# Patient Record
Sex: Male | Born: 1970 | Race: White | Hispanic: No | Marital: Married | Smoking: Never smoker
Health system: Southern US, Community
[De-identification: ages and names within clinical notes are randomized; demographics above are authoritative.]

## PROBLEM LIST (undated history)

## (undated) DIAGNOSIS — I10 Essential (primary) hypertension: Secondary | ICD-10-CM

## (undated) HISTORY — PX: NO PAST SURGERIES: SHX2092

---

## 2015-09-16 ENCOUNTER — Other Ambulatory Visit: Payer: Self-pay | Admitting: Internal Medicine

## 2015-09-16 DIAGNOSIS — R748 Abnormal levels of other serum enzymes: Secondary | ICD-10-CM

## 2015-09-26 ENCOUNTER — Ambulatory Visit
Admission: RE | Admit: 2015-09-26 | Discharge: 2015-09-26 | Disposition: A | Discharge status: Home / Self Care | Payer: PRIVATE HEALTH INSURANCE | Source: Ambulatory Visit | Attending: Internal Medicine | Admitting: Internal Medicine

## 2015-09-26 DIAGNOSIS — R748 Abnormal levels of other serum enzymes: Secondary | ICD-10-CM

## 2017-01-25 DIAGNOSIS — F419 Anxiety disorder, unspecified: Secondary | ICD-10-CM

## 2017-01-25 DIAGNOSIS — K219 Gastro-esophageal reflux disease without esophagitis: Secondary | ICD-10-CM | POA: Insufficient documentation

## 2017-01-25 DIAGNOSIS — Z8719 Personal history of other diseases of the digestive system: Secondary | ICD-10-CM | POA: Insufficient documentation

## 2017-01-25 DIAGNOSIS — F1010 Alcohol abuse, uncomplicated: Secondary | ICD-10-CM | POA: Insufficient documentation

## 2017-01-25 DIAGNOSIS — R072 Precordial pain: Secondary | ICD-10-CM | POA: Insufficient documentation

## 2017-01-25 DIAGNOSIS — R7989 Other specified abnormal findings of blood chemistry: Secondary | ICD-10-CM

## 2017-01-25 HISTORY — DX: Alcohol abuse, uncomplicated: F10.10

## 2017-01-25 HISTORY — DX: Anxiety disorder, unspecified: F41.9

## 2017-01-25 HISTORY — DX: Personal history of other diseases of the digestive system: Z87.19

## 2017-01-25 HISTORY — DX: Precordial pain: R07.2

## 2017-01-25 HISTORY — DX: Other specified abnormal findings of blood chemistry: R79.89

## 2017-01-25 HISTORY — DX: Gastro-esophageal reflux disease without esophagitis: K21.9

## 2019-07-09 ENCOUNTER — Encounter (HOSPITAL_COMMUNITY): Payer: Self-pay | Admitting: Emergency Medicine

## 2019-07-09 ENCOUNTER — Emergency Department (HOSPITAL_COMMUNITY)
Admission: EM | Admit: 2019-07-09 | Discharge: 2019-07-09 | Disposition: A | Discharge status: Home / Self Care | Payer: Worker's Compensation | Attending: Emergency Medicine | Admitting: Emergency Medicine

## 2019-07-09 ENCOUNTER — Emergency Department (HOSPITAL_COMMUNITY): Payer: Worker's Compensation

## 2019-07-09 DIAGNOSIS — Y9389 Activity, other specified: Secondary | ICD-10-CM | POA: Diagnosis not present

## 2019-07-09 DIAGNOSIS — S060X1A Concussion with loss of consciousness of 30 minutes or less, initial encounter: Secondary | ICD-10-CM | POA: Diagnosis not present

## 2019-07-09 DIAGNOSIS — S5001XA Contusion of right elbow, initial encounter: Secondary | ICD-10-CM | POA: Diagnosis not present

## 2019-07-09 DIAGNOSIS — Y990 Civilian activity done for income or pay: Secondary | ICD-10-CM | POA: Insufficient documentation

## 2019-07-09 DIAGNOSIS — Y929 Unspecified place or not applicable: Secondary | ICD-10-CM | POA: Diagnosis not present

## 2019-07-09 DIAGNOSIS — W19XXXA Unspecified fall, initial encounter: Secondary | ICD-10-CM

## 2019-07-09 DIAGNOSIS — T07XXXA Unspecified multiple injuries, initial encounter: Secondary | ICD-10-CM | POA: Diagnosis present

## 2019-07-09 HISTORY — DX: Essential (primary) hypertension: I10

## 2019-07-09 LAB — BASIC METABOLIC PANEL
Anion gap: 11 (ref 5–15)
BUN: 14 mg/dL (ref 6–20)
CO2: 24 mmol/L (ref 22–32)
Calcium: 8.9 mg/dL (ref 8.9–10.3)
Chloride: 105 mmol/L (ref 98–111)
Creatinine, Ser: 1.11 mg/dL (ref 0.61–1.24)
GFR calc Af Amer: >60 mL/min (ref >60)
GFR calc non Af Amer: >60 mL/min (ref >60)
Glucose, Bld: 156 mg/dL — ABNORMAL HIGH (ref 70–99)
Potassium: 3.6 mmol/L (ref 3.5–5.1)
Sodium: 140 mmol/L (ref 135–145)

## 2019-07-09 LAB — CBC
HCT: 41.3 % (ref 39.0–52.0)
Hemoglobin: 14.5 g/dL (ref 13.0–17.0)
MCH: 30.8 pg (ref 26.0–34.0)
MCHC: 35.1 g/dL (ref 30.0–36.0)
MCV: 87.7 fL (ref 80.0–100.0)
Platelets: 212 10*3/uL (ref 150–400)
RBC: 4.71 MIL/uL (ref 4.22–5.81)
RDW: 12.3 % (ref 11.5–15.5)
WBC: 5.8 10*3/uL (ref 4.0–10.5)
nRBC: 0 % (ref 0.0–0.2)
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
## 2019-07-09 NOTE — ED Notes (Signed)
Pt being transported to CT
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
## 2019-07-09 NOTE — ED Notes (Signed)
ED Provider at bedside. 

## 2020-02-08 DEATH — deceased

## 2021-05-23 DIAGNOSIS — I10 Essential (primary) hypertension: Secondary | ICD-10-CM | POA: Insufficient documentation

## 2021-05-24 ENCOUNTER — Encounter: Payer: Self-pay | Admitting: Cardiology

## 2021-05-24 ENCOUNTER — Ambulatory Visit (INDEPENDENT_AMBULATORY_CARE_PROVIDER_SITE_OTHER): Payer: PRIVATE HEALTH INSURANCE | Admitting: Cardiology

## 2021-05-24 ENCOUNTER — Other Ambulatory Visit: Payer: Self-pay

## 2021-05-24 VITALS — BP 120/80 | HR 68 | Ht 70.0 in | Wt 177.0 lb

## 2021-05-24 DIAGNOSIS — E785 Hyperlipidemia, unspecified: Secondary | ICD-10-CM

## 2021-05-24 DIAGNOSIS — R079 Chest pain, unspecified: Secondary | ICD-10-CM | POA: Diagnosis not present

## 2021-05-24 DIAGNOSIS — R002 Palpitations: Secondary | ICD-10-CM

## 2021-05-24 DIAGNOSIS — I10 Essential (primary) hypertension: Secondary | ICD-10-CM

## 2021-05-24 MED ORDER — METOPROLOL TARTRATE 100 MG PO TABS: ORAL_TABLET | ORAL | 0 refills | Status: DC

## 2021-05-24 MED ORDER — NITROGLYCERIN 0.4 MG SL SUBL: 0.4000 mg | SUBLINGUAL_TABLET | SUBLINGUAL | 3 refills | Status: DC | PRN

## 2021-05-24 NOTE — Progress Notes (Signed)
Cardiology Office Note:    Date:  05/24/2021   ID:  Travis Schneider, DOB [birth year], MRN [id number]  PCP:  [facility]  Cardiologist:  Thomasene Ripple, DO  Electrophysiologist:  None   Referring MD: No ref. provider found   No chief complaint on file. " I have been experiencing chest pain"  History of Present Illness:    Travis Schneider is a 50 y.o. male with a hx of hypertension, hyperlipidemia, family history of premature coronary artery disease with his father undergoing coronary bypass surgery at the age of 37 is here today to be eval for chest pain.  Patient tells me that he has been experiencing intermittent chest discomfort.  He describes this as a midsternal aching sensation which symptom is dull.  Most times it radiates to his shoulder and then down his arm and he sometimes have numbness and tingling.  In addition he has reported experiencing intermittent palpitations.  Which he described as an abrupt onset of fast heartbeat which last for minutes at a time. The chest pain is really the most bothersome.  Past Medical History:  Diagnosis Date   Hypertension     History reviewed. No pertinent surgical history.  Current Medications: Current Meds  Medication Sig   albuterol (VENTOLIN HFA) 108 (90 Base) MCG/ACT inhaler Inhale into the lungs every 6 (six) hours as needed for wheezing or shortness of breath.   ALPRAZolam (XANAX) 0.5 MG tablet Take 0.5 mg by mouth every 8 (eight) hours as needed for anxiety.   amLODipine (NORVASC) 10 MG tablet Take 1 tablet by mouth daily.   aspirin 81 MG EC tablet Take 81 mg by mouth daily.   Cholecalciferol 25 MCG (1000 UT) capsule Take 1,000 Units by mouth at bedtime.   Coenzyme Q10 100 MG capsule Take 100 mg by mouth daily.   levocetirizine (XYZAL) 5 MG tablet Take 5 mg by mouth daily.   metoprolol tartrate (LOPRESSOR) 100 MG tablet Take 2 hours prior to CT   metoprolol tartrate (LOPRESSOR) 25 MG tablet Take 25 mg by mouth  every morning.   nitroGLYCERIN (NITROSTAT) 0.4 MG SL tablet Place 1 tablet (0.4 mg total) under the tongue every 5 (five) minutes as needed for chest pain. Up to three times.   pantoprazole (PROTONIX) 40 MG tablet Take 1 tablet by mouth 2 (two) times daily.   simvastatin (ZOCOR) 20 MG tablet Take 20 mg by mouth daily.   zolpidem (AMBIEN) 10 MG tablet Take 10 mg by mouth at bedtime.     Allergies:   Patient has no known allergies.   Social History   Socioeconomic History   Marital status: Married    Spouse name: Not on file   Number of children: Not on file   Years of education: Not on file   Highest education level: Not on file  Occupational History   Not on file  Tobacco Use   Smoking status: Never   Smokeless tobacco: Never  Substance and Sexual Activity   Alcohol use: Yes   Drug use: Not Currently   Sexual activity: Not on file  Other Topics Concern   Not on file  Social History Narrative   Not on file   Social Determinants of Health   Financial Resource Strain: Not on file  Food Insecurity: Not on file  Transportation Needs: Not on file  Physical Activity: Not on file  Stress: Not on file  Social Connections: Not on file     Family History:  The patient's family history is not on file.  ROS:   Review of Systems  Constitution: Negative for decreased appetite, fever and weight gain.  HENT: Negative for congestion, ear discharge, hoarse voice and sore throat.   Eyes: Negative for discharge, redness, vision loss in right eye and visual halos.  Cardiovascular: Reports chest pain, and palpitation.  Negative for dyspnea on exertion, leg swelling, orthopnea. Respiratory: Negative for cough, hemoptysis, shortness of breath and snoring.   Endocrine: Negative for heat intolerance and polyphagia.  Hematologic/Lymphatic: Negative for bleeding problem. Does not bruise/bleed easily.  Skin: Negative for flushing, nail changes, rash and suspicious lesions.  Musculoskeletal:  Negative for arthritis, joint pain, muscle cramps, myalgias, neck pain and stiffness.  Gastrointestinal: Negative for abdominal pain, bowel incontinence, diarrhea and excessive appetite.  Genitourinary: Negative for decreased libido, genital sores and incomplete emptying.  Neurological: Negative for brief paralysis, focal weakness, headaches and loss of balance.  Psychiatric/Behavioral: Negative for altered mental status, depression and suicidal ideas.  Allergic/Immunologic: Negative for HIV exposure and persistent infections.    EKGs/Labs/Other Studies Reviewed:    The following studies were reviewed today:   EKG:  The ekg ordered today demonstrates sinus rhythm, heart 60 bpm.  Recent Labs: No results found for requested labs within last 8760 hours.  Recent Lipid Panel No results found for: CHOL, TRIG, HDL, CHOLHDL, VLDL, LDLCALC, LDLDIRECT  Physical Exam:    VS:  BP 120/80   Pulse 68   Ht 5\' 10"  (1.778 m)   Wt 177 lb (80.3 kg)   SpO2 97%   BMI 25.40 kg/m     Wt Readings from Last 3 Encounters:  05/24/21 177 lb (80.3 kg)     GEN: Well nourished, well developed in no acute distress HEENT: Normal NECK: No JVD; No carotid bruits LYMPHATICS: No lymphadenopathy CARDIAC: S1S2 noted,RRR, no murmurs, rubs, gallops RESPIRATORY:  Clear to auscultation without rales, wheezing or rhonchi  ABDOMEN: Soft, non-tender, non-distended, +bowel sounds, no guarding. EXTREMITIES: No edema, No cyanosis, no clubbing MUSCULOSKELETAL:  No deformity  SKIN: Warm and dry NEUROLOGIC:  Alert and oriented x 3, non-focal PSYCHIATRIC:  Normal affect, good insight  ASSESSMENT:    1. Chest pain, unspecified type   2. Palpitations   3. Hypertension, unspecified type   4. Hyperlipidemia, unspecified hyperlipidemia type    PLAN:     1.  The symptoms chest pain is concerning, this patient does have intermediate risk for coronary artery disease and at this time I would like to pursue an ischemic  evaluation in this patient.  Shared decision a coronary CTA at this time is appropriate.  I have discussed with the patient about the testing.  The patient has no IV contrast allergy and is agreeable to proceed with this test. Sublingual nitroglycerin prescription was sent, its protocol and 911 protocol explained and the patient vocalized understanding questions were answered to the patient's satisfaction  2.  He is currently wearing a ZIO monitor.  Of asked the patient to please request from his PCP that a copy be sent to my office.  3.  Blood pressure is acceptable, continue with current antihypertensive regimen.  4.  Hyperlipidemia - continue with current statin medication.  The patient is in agreement with the above plan. The patient left the office in stable condition.  The patient will follow up in 12 weeks or sooner if needed.   Medication Adjustments/Labs and Tests Ordered: Current medicines are reviewed at length with the patient today.  Concerns regarding  medicines are outlined above.  Orders Placed This Encounter  Procedures   CT CORONARY MORPH W/CTA COR W/SCORE W/CA W/CM &/OR WO/CM   TSH   Basic metabolic panel   Magnesium   EKG 12-Lead   Meds ordered this encounter  Medications   metoprolol tartrate (LOPRESSOR) 100 MG tablet    Sig: Take 2 hours prior to CT    Dispense:  1 tablet    Refill:  0   nitroGLYCERIN (NITROSTAT) 0.4 MG SL tablet    Sig: Place 1 tablet (0.4 mg total) under the tongue every 5 (five) minutes as needed for chest pain. Up to three times.    Dispense:  45 tablet    Refill:  3    Patient Instructions  Medication Instructions:  Your physician has recommended you make the following change in your medication:  START: Nitroglycerin 0.4 mg take one tablet by mouth every 5 minutes up to three times as needed for chest pain.  *If you need a refill on your cardiac medications before your next appointment, please call your pharmacy*   Lab Work: Your  physician recommends that you have lab work drawn:  TODAY: TSH 3-7 days before CT: BMET, Mag If you have labs (blood work) drawn today and your tests are completely normal, you will receive your results only by: MyChart Message (if you have MyChart) OR A paper copy in the mail If you have any lab test that is abnormal or we need to change your treatment, we will call you to review the results.   Testing/Procedures: Your cardiac CT will be scheduled at one of the below locations:   [facility] [address] [place], [place] [address] [phone]   If scheduled at [facility], please arrive at the [facility] main [facility] (entrance A) of [facility]  30 minutes prior to test start time. Proceed to the [facility] Rad[facility] Department (first floor) to check-in and test prep.   Please follow these instructions carefully (unless otherwise directed):  On the Night Before the Test: Be sure to Drink plenty of water. Do not consume any caffeinated/decaffeinated beverages or chocolate 12 hours prior to your test. Do not take any antihistamines 12 hours prior to your test.  On the Day of the Test: Drink plenty of water until 1 hour prior to the test. Do not eat any food 4 hours prior to the test. You may take your regular medications prior to the test.  HOLD Lopressor 25 mg day of procedure Take metoprolol (Lopressor) 100mg  two hours prior to test. HOLD Furosemide/Hydrochlorothiazide morning of the test. FEMALES- please wear underwire-free bra if available.      After the Test: Drink plenty of water. After receiving IV contrast, you may experience a mild flushed feeling. This is normal. On occasion, you may experience a mild rash up to 24 hours after the test. This is not dangerous. If this occurs, you can take Benadryl 25 mg and increase your fluid intake. If you experience trouble breathing, this can be serious. If it is severe call 911 IMMEDIATELY. If  it is mild, please call our office. If you take any of these medications: Glipizide/Metformin, Avandament, Glucavance, please do not take 48 hours after completing test unless otherwise instructed.   Once we have confirmed authorization from your insurance company, we will call you to set up a date and time for your test. Based on how quickly your insurance processes prior authorizations requests, please allow up  to 4 weeks to be contacted for scheduling your Cardiac CT appointment. Be advised that routine Cardiac CT appointments could be scheduled as many as 8 weeks after your provider has ordered it.  For non-scheduling related questions, please contact the cardiac imaging nurse navigator should you have any questions/concerns: Rockwell [place], Cardiac Imaging Nurse Navigator Larey Brick, Cardiac Imaging Nurse Navigator [facility] Direct Office Dial: [phone]   For scheduling needs, including cancellations and rescheduling, please call [place], [phone].    Follow-Up: At [facility], you and your health needs are our priority.  As part of our continuing mission to provide you with exceptional heart care, we have created designated Provider Care Teams.  These Care Teams include your primary Cardiologist (physician) and Advanced Practice Providers (APPs -  Physician Assistants and Nurse Practitioners) who all work together to provide you with the care you need, when you need it.  We recommend signing up for the patient portal called "MyChart".  Sign up information is provided on this After Visit Summary.  MyChart is used to connect with patients for Virtual Visits (Telemedicine).  Patients are able to view lab/test results, encounter notes, upcoming appointments, etc.  Non-urgent messages can be sent to your provider as well.   To learn more about what you can do with MyChart, go to [email or web address].    Your next appointment:   12 week(s)  The  format for your next appointment:   In Person  Provider:   Thomasene Ripple, DO   Other Instructions    Adopting a Healthy Lifestyle.  Know what a healthy weight is for you (roughly BMI <25) and aim to maintain this   Aim for 7+ servings of fruits and vegetables daily   65-80+ fluid ounces of water or unsweet tea for healthy kidneys   Limit to max 1 drink of alcohol per day; avoid smoking/tobacco   Limit animal fats in diet for cholesterol and heart health - choose grass fed whenever available   Avoid highly processed foods, and foods high in saturated/trans fats   Aim for low stress - take time to unwind and care for your mental health   Aim for 150 min of moderate intensity exercise weekly for heart health, and weights twice weekly for bone health   Aim for 7-9 hours of sleep daily   When it comes to diets, agreement about the perfect plan isnt easy to find, even among the experts. Experts at the [facility] of [employer] developed an idea known as the Healthy Eating Plate. Just imagine a plate divided into logical, healthy portions.   The emphasis is on diet quality:   Load up on vegetables and fruits - one-half of your plate: Aim for color and variety, and remember that potatoes dont count.   Go for whole grains - one-quarter of your plate: Whole wheat, barley, wheat berries, quinoa, oats, brown rice, and foods made with them. If you want pasta, go with whole wheat pasta.   Protein power - one-quarter of your plate: Fish, chicken, beans, and nuts are all healthy, versatile protein sources. Limit red meat.   The diet, however, does go beyond the plate, offering a few other suggestions.   Use healthy plant oils, such as olive, canola, soy, corn, sunflower and peanut. Check the labels, and avoid partially hydrogenated oil, which have unhealthy trans fats.   If youre thirsty, drink water. Coffee and tea are good in moderation, but skip sugary drinks and limit  milk and  dairy products to one or two daily servings.   The type of carbohydrate in the diet is more important than the amount. Some sources of carbohydrates, such as vegetables, fruits, whole grains, and beans-are healthier than others.   Finally, stay active  Signed, Thomasene Ripple, DO  05/24/2021 5:35 PM    [facility]

## 2021-05-24 NOTE — Patient Instructions (Signed)
Medication Instructions:  Your physician has recommended you make the following change in your medication:  START: Nitroglycerin 0.4 mg take one tablet by mouth every 5 minutes up to three times as needed for chest pain.  *If you need a refill on your cardiac medications before your next appointment, please call your pharmacy*   Lab Work: Your physician recommends that you have lab work drawn:  TODAY: TSH 3-7 days before CT: BMET, Mag If you have labs (blood work) drawn today and your tests are completely normal, you will receive your results only by: MyChart Message (if you have MyChart) OR A paper copy in the mail If you have any lab test that is abnormal or we need to change your treatment, we will call you to review the results.   Testing/Procedures: Your cardiac CT will be scheduled at one of the below locations:   [facility] [address] [place], [place] [address] [phone]   If scheduled at [facility], please arrive at the [facility] main entrance (entrance A) of [facility] 30 minutes prior to test start time. Proceed to the [facility] Radiology Department (first floor) to check-in and test prep.   Please follow these instructions carefully (unless otherwise directed):  On the Night Before the Test: Be sure to Drink plenty of water. Do not consume any caffeinated/decaffeinated beverages or chocolate 12 hours prior to your test. Do not take any antihistamines 12 hours prior to your test.  On the Day of the Test: Drink plenty of water until 1 hour prior to the test. Do not eat any food 4 hours prior to the test. You may take your regular medications prior to the test.  HOLD Lopressor 25 mg day of procedure Take metoprolol (Lopressor) 100mg  two hours prior to test. HOLD Furosemide/Hydrochlorothiazide morning of the test. FEMALES- please wear underwire-free bra if available.      After the Test: Drink plenty of water. After  receiving IV contrast, you may experience a mild flushed feeling. This is normal. On occasion, you may experience a mild rash up to 24 hours after the test. This is not dangerous. If this occurs, you can take Benadryl 25 mg and increase your fluid intake. If you experience trouble breathing, this can be serious. If it is severe call 911 IMMEDIATELY. If it is mild, please call our office. If you take any of these medications: Glipizide/Metformin, Avandament, Glucavance, please do not take 48 hours after completing test unless otherwise instructed.   Once we have confirmed authorization from your insurance company, we will call you to set up a date and time for your test. Based on how quickly your insurance processes prior authorizations requests, please allow up to 4 weeks to be contacted for scheduling your Cardiac CT appointment. Be advised that routine Cardiac CT appointments could be scheduled as many as 8 weeks after your provider has ordered it.  For non-scheduling related questions, please contact the cardiac imaging nurse navigator should you have any questions/concerns: , Cardiac Imaging Nurse Navigator Rockwell Alexandria, Cardiac Imaging Nurse Navigator [facility] Direct Office Dial: [phone]   For scheduling needs, including cancellations and rescheduling, please call [phone], [phone].    Follow-Up: At [facility], you and your health needs are our priority.  As part of our continuing mission to provide you with exceptional heart care, we have created designated Provider Care Teams.  These Care Teams include your primary Cardiologist (physician) and  Advanced Practice Providers (APPs -  Physician Assistants and Nurse Practitioners) who all work together to provide you with the care you need, when you need it.  We recommend signing up for the patient portal called "MyChart".  Sign up information is provided on this After Visit Summary.   MyChart is used to connect with patients for Virtual Visits (Telemedicine).  Patients are able to view lab/test results, encounter notes, upcoming appointments, etc.  Non-urgent messages can be sent to your provider as well.   To learn more about what you can do with MyChart, go to [email or web address].    Your next appointment:   12 week(s)  The format for your next appointment:   In Person  Provider:   Thomasene Ripple, DO   Other Instructions

## 2021-05-25 LAB — TSH: TSH: 1.71 u[IU]/mL (ref 0.450–4.500)

## 2021-06-01 ENCOUNTER — Telehealth (HOSPITAL_COMMUNITY): Payer: Self-pay | Admitting: Emergency Medicine

## 2021-06-01 NOTE — Telephone Encounter (Signed)
Reaching out to patient to offer assistance regarding upcoming cardiac imaging study; pt verbalizes understanding of appt date/time, parking situation and where to check in, pre-test NPO status and medications ordered, and verified current allergies; name and call back number provided for further questions should they arise Rockwell Alexandria RN Navigator Cardiac Imaging [facility] [phone] office [phone] cell  100mg  metoprolol tart 2 hr prior  

## 2021-06-06 ENCOUNTER — Ambulatory Visit (HOSPITAL_COMMUNITY)
Admission: RE | Admit: 2021-06-06 | Discharge: 2021-06-06 | Disposition: A | Discharge status: Home / Self Care | Payer: PRIVATE HEALTH INSURANCE | Source: Ambulatory Visit | Attending: Cardiology | Admitting: Cardiology

## 2021-06-06 ENCOUNTER — Other Ambulatory Visit: Payer: Self-pay

## 2021-06-06 ENCOUNTER — Encounter (HOSPITAL_COMMUNITY): Payer: Self-pay

## 2021-06-06 ENCOUNTER — Other Ambulatory Visit (HOSPITAL_COMMUNITY): Payer: Self-pay | Admitting: Emergency Medicine

## 2021-06-06 DIAGNOSIS — R079 Chest pain, unspecified: Secondary | ICD-10-CM

## 2021-06-06 DIAGNOSIS — R931 Abnormal findings on diagnostic imaging of heart and coronary circulation: Secondary | ICD-10-CM

## 2021-06-06 DIAGNOSIS — I2510 Atherosclerotic heart disease of native coronary artery without angina pectoris: Secondary | ICD-10-CM

## 2021-06-06 MED ORDER — NITROGLYCERIN 0.4 MG SL SUBL
0.8000 mg | SUBLINGUAL_TABLET | Freq: Once | SUBLINGUAL | Status: AC
  Administered 2021-06-06: 0.8 mg via SUBLINGUAL

## 2021-06-06 MED ORDER — NITROGLYCERIN 0.4 MG SL SUBL
SUBLINGUAL_TABLET | SUBLINGUAL | Status: AC
  Filled 2021-06-06: qty 2

## 2021-06-06 MED ORDER — ROSUVASTATIN CALCIUM 20 MG PO TABS: 20.0000 mg | ORAL_TABLET | Freq: Every day | ORAL | 3 refills | Status: DC

## 2021-06-06 MED ORDER — IOHEXOL 350 MG/ML SOLN
95.0000 mL | Freq: Once | INTRAVENOUS | Status: AC | PRN
  Administered 2021-06-06: 95 mL via INTRAVENOUS

## 2021-06-06 NOTE — Progress Notes (Signed)
Patient tolerated CT well. Gave bottle of water after. Vital signs stable encourage to drink water throughout day. Reasons explained and verbalized understanding.

## 2021-06-06 NOTE — Progress Notes (Signed)
Prescription sent to pharmacy.

## 2021-06-13 ENCOUNTER — Telehealth: Payer: Self-pay

## 2021-06-13 NOTE — Telephone Encounter (Signed)
Called patient to give monitor results and medication change suggestions if needed. No answer, no voicemail set up.

## 2021-06-13 NOTE — Telephone Encounter (Signed)
Spoke with patient, asked if his symptoms have improved with Lopressor. He states they have, will continue current medication regimen. Patient states he will reach out if the symptoms get worse.

## 2021-08-28 ENCOUNTER — Other Ambulatory Visit: Payer: Self-pay

## 2021-08-29 ENCOUNTER — Encounter: Payer: Self-pay | Admitting: Cardiology

## 2021-08-29 ENCOUNTER — Other Ambulatory Visit: Payer: Self-pay

## 2021-08-29 ENCOUNTER — Ambulatory Visit (INDEPENDENT_AMBULATORY_CARE_PROVIDER_SITE_OTHER): Payer: PRIVATE HEALTH INSURANCE | Admitting: Cardiology

## 2021-08-29 VITALS — BP 110/80 | HR 64 | Ht 70.0 in | Wt 179.0 lb

## 2021-08-29 DIAGNOSIS — I10 Essential (primary) hypertension: Secondary | ICD-10-CM | POA: Diagnosis not present

## 2021-08-29 DIAGNOSIS — I2510 Atherosclerotic heart disease of native coronary artery without angina pectoris: Secondary | ICD-10-CM

## 2021-08-29 DIAGNOSIS — I471 Supraventricular tachycardia, unspecified: Secondary | ICD-10-CM | POA: Insufficient documentation

## 2021-08-29 DIAGNOSIS — E785 Hyperlipidemia, unspecified: Secondary | ICD-10-CM | POA: Diagnosis not present

## 2021-08-29 NOTE — Progress Notes (Signed)
Cardiology Office Note:    Date:  08/29/2021   ID:  Travis Schneider, DOB Oct 11, 1971, MRN [id number]  PCP:  Don Broach, [facility]  Cardiologist:  Thomasene Ripple, DO  Electrophysiologist:  None   Referring MD: Pc, [facility]*   Chief Complaint  Patient presents with   Follow-up    History of Present Illness:    Travis Schneider is a 50 y.o. male with a hx of coronary artery disease seen on his coronary CTA recently, hypertension, hyperlipidemia is here today for follow-up visit.  I last saw the patient he has no chest pain sent in for coronary CTA.  He was able to get his testing done.  I have previously been able to share the patient result with him.  Today he offers no complaints at this time.  Past Medical History:  Diagnosis Date   Alcohol abuse 01/25/2017   Anxiety 01/25/2017   Elevated LFTs 01/25/2017   Gastroesophageal reflux disease without esophagitis 01/25/2017   History of hiatal hernia 01/25/2017   Hypertension    Precordial pain 01/25/2017    Past Surgical History:  Procedure Laterality Date   NO PAST SURGERIES      Current Medications: Current Meds  Medication Sig   albuterol (VENTOLIN HFA) 108 (90 Base) MCG/ACT inhaler Inhale into the lungs every 6 (six) hours as needed for wheezing or shortness of breath.   ALPRAZolam (XANAX) 0.5 MG tablet Take 0.5 mg by mouth every 8 (eight) hours as needed for anxiety.   amLODipine (NORVASC) 10 MG tablet Take 1 tablet by mouth daily.   aspirin 81 MG EC tablet Take 81 mg by mouth daily.   Cholecalciferol 25 MCG (1000 UT) capsule Take 1,000 Units by mouth at bedtime.   Coenzyme Q10 100 MG capsule Take 100 mg by mouth daily.   levocetirizine (XYZAL) 5 MG tablet Take 5 mg by mouth daily.   metoprolol tartrate (LOPRESSOR) 25 MG tablet Take 25 mg by mouth every morning.   nitroGLYCERIN (NITROSTAT) 0.6 MG SL tablet Place 0.6 mg under the tongue every 5 (five) minutes as needed for chest pain.   pantoprazole (PROTONIX)  40 MG tablet Take 1 tablet by mouth 2 (two) times daily.   rosuvastatin (CRESTOR) 20 MG tablet Take 1 tablet (20 mg total) by mouth daily.   zolpidem (AMBIEN) 10 MG tablet Take 10 mg by mouth at bedtime.     Allergies:   Patient has no known allergies.   Social History   Socioeconomic History   Marital status: Married    Spouse name: Not on file   Number of children: Not on file   Years of education: Not on file   Highest education level: Not on file  Occupational History   Not on file  Tobacco Use   Smoking status: Never   Smokeless tobacco: Never  Substance and Sexual Activity   Alcohol use: Yes   Drug use: Not Currently   Sexual activity: Not on file  Other Topics Concern   Not on file  Social History Narrative   Not on file   Social Determinants of Health   Financial Resource Strain: Not on file  Food Insecurity: Not on file  Transportation Needs: Not on file  Physical Activity: Not on file  Stress: Not on file  Social Connections: Not on file     Family History: The patient's family history includes COPD in his mother; Diabetes in his half-sister and paternal grandfather; Heart disease in his father.  ROS:   Review of Systems  Constitution: Negative for decreased appetite, fever and weight gain.  HENT: Negative for congestion, ear discharge, hoarse voice and sore throat.   Eyes: Negative for discharge, redness, vision loss in right eye and visual halos.  Cardiovascular: Negative for chest pain, dyspnea on exertion, leg swelling, orthopnea and palpitations.  Respiratory: Negative for cough, hemoptysis, shortness of breath and snoring.   Endocrine: Negative for heat intolerance and polyphagia.  Hematologic/Lymphatic: Negative for bleeding problem. Does not bruise/bleed easily.  Skin: Negative for flushing, nail changes, rash and suspicious lesions.  Musculoskeletal: Negative for arthritis, joint pain, muscle cramps, myalgias, neck pain and stiffness.   Gastrointestinal: Negative for abdominal pain, bowel incontinence, diarrhea and excessive appetite.  Genitourinary: Negative for decreased libido, genital sores and incomplete emptying.  Neurological: Negative for brief paralysis, focal weakness, headaches and loss of balance.  Psychiatric/Behavioral: Negative for altered mental status, depression and suicidal ideas.  Allergic/Immunologic: Negative for HIV exposure and persistent infections.    EKGs/Labs/Other Studies Reviewed:    The following studies were reviewed today:   EKG:  The ekg ordered today demonstrates   CCTA 06/06/2021 Coronary Arteries:  Normal coronary origin.  Right dominance.   Left main: The left main is a large caliber vessel with a normal take off from the left coronary cusp that bifurcates to form a left anterior descending artery and a left circumflex artery. There is no plaque or stenosis.   Left anterior descending artery: The LAD has mild (25-49) mixed plaque stenosis in the proximal to mid vessel. The LAD gives off 2 large diagonal branches; there is minimal (0-24) calcified plaque in the proximal D1.   Ramus intermedius: Patent with no evidence of plaque or stenosis.   Left circumflex artery: The LCX is non-dominant and patent with no evidence of plaque or stenosis.   Right coronary artery: The RCA is dominant with normal take off from the right coronary cusp. There is minimal (0-24) calcified plaque in the proximal vessel and mid vessel. The RCA terminates as a PDA and right posterolateral branch without evidence of plaque or stenosis.   Right Atrium: Right atrial size is within normal limits.   Right Ventricle: The right ventricular cavity is within normal limits.   Left Atrium: Left atrial size is normal in size with no left atrial appendage filling defect.   Left Ventricle: The ventricular cavity size is within normal limits. There are no stigmata of prior infarction. There is no  abnormal filling defect.   Pulmonary veins: Normal pulmonary venous drainage.   Pericardium: Normal thickness with no significant effusion or calcium present.   Cardiac valves: The aortic valve is trileaflet without significant calcification. The mitral valve is normal structure without significant calcification.   Aorta: Normal caliber with no significant disease.   Extra-cardiac findings: See attached radiology report for non-cardiac structures.   IMPRESSION: 1. Coronary calcium score of 137. This was 90 percentile for age-, sex, and race-matched controls.   2. Normal coronary origin with right dominance.   3. Mild CAD (25-49) in the proximal to mid LAD.   4. Study will be sent for FFR.   RECOMMENDATIONS: CAD-RADS 2: Mild non-obstructive CAD (25-49%). Consider non-atherosclerotic causes of chest pain. Consider preventive therapy and risk factor modification.   Olga Millers, MD     Electronically Signed   By: Olga Millers M.D.   On: 06/06/2021 11:52    Addended by Lewayne Bunting, MD on 06/06/2021 11:54 AM  Study Result  Narrative & Impression  EXAM: OVER-READ INTERPRETATION  CT CHEST   The following report is an over-read performed by radiologist Dr. Irish Lack of [facility] on 06/06/2021. This over-read does not include interpretation of cardiac or coronary anatomy or pathology. The coronary CTA interpretation by the cardiologist is attached.   COMPARISON:  CTA of the chest at [facility] on 05/22/2021   FINDINGS: Vascular: No significant noncardiac vascular findings.   Mediastinum/Nodes: Visualized mediastinum and hilar regions demonstrate no lymphadenopathy or masses.   Lungs/Pleura: Visualized lungs show no evidence of pulmonary edema, consolidation, pneumothorax, nodule or pleural fluid.   Upper Abdomen: Stable hepatic cysts in the visualized upper liver.   Musculoskeletal: No chest wall mass or suspicious bone  lesions identified.   IMPRESSION: No significant incidental findings.  Stable hepatic cysts.   Electronically Signed: By: Irish Lack M.D. On: 06/06/2021 10:57    Recent Labs: 05/24/2021: TSH 1.710  Recent Lipid Panel No results found for: CHOL, TRIG, HDL, CHOLHDL, VLDL, LDLCALC, LDLDIRECT  Physical Exam:    VS:  BP 110/80 (BP Location: Right Arm, Patient Position: Sitting, Cuff Size: Normal)   Pulse 64   Ht 5\' 10"  (1.778 m)   Wt 179 lb (81.2 kg)   SpO2 96%   BMI 25.68 kg/m     Wt Readings from Last 3 Encounters:  08/29/21 179 lb (81.2 kg)  05/24/21 177 lb (80.3 kg)     GEN: Well nourished, well developed in no acute distress HEENT: Normal NECK: No JVD; No carotid bruits LYMPHATICS: No lymphadenopathy CARDIAC: S1S2 noted,RRR, no murmurs, rubs, gallops RESPIRATORY:  Clear to auscultation without rales, wheezing or rhonchi  ABDOMEN: Soft, non-tender, non-distended, +bowel sounds, no guarding. EXTREMITIES: No edema, No cyanosis, no clubbing MUSCULOSKELETAL:  No deformity  SKIN: Warm and dry NEUROLOGIC:  Alert and oriented x 3, non-focal PSYCHIATRIC:  Normal affect, good insight  ASSESSMENT:    1. Coronary artery disease involving native coronary artery of native heart without angina pectoris   2. Primary hypertension   3. Hyperlipidemia, unspecified hyperlipidemia type   4. PSVT (paroxysmal supraventricular tachycardia) (HCC)    PLAN:    He does have coronary artery disease no evidence of angina.  We will continue patient on aspirin and Crestor.  We talked about the new diagnosis.  He has no questions.   He still does have some brief fluttering but tells me it only lasted a few seconds and then resolved.  No medication changes at this time.  He is going to watch his caffeine as well as any other stimulant.  The patient is in agreement with the above plan. The patient left the office in stable condition.  The patient will follow up in 1 year or sooner if  needed.   Medication Adjustments/Labs and Tests Ordered: Current medicines are reviewed at length with the patient today.  Concerns regarding medicines are outlined above.  No orders of the defined types were placed in this encounter.  No orders of the defined types were placed in this encounter.   Patient Instructions  Medication Instructions:  Your physician recommends that you continue on your current medications as directed. Please refer to the Current Medication list given to you today.  *If you need a refill on your cardiac medications before your next appointment, please call your pharmacy*   Lab Work: None If you have labs (blood work) drawn today and your tests are completely normal, you will receive your results only  by: MyChart Message (if you have MyChart) OR A paper copy in the mail If you have any lab test that is abnormal or we need to change your treatment, we will call you to review the results.   Testing/Procedures: None   Follow-Up: At [facility], you and your health needs are our priority.  As part of our continuing mission to provide you with exceptional heart care, we have created designated Provider Care Teams.  These Care Teams include your primary Cardiologist (physician) and Advanced Practice Providers (APPs -  Physician Assistants and Nurse Practitioners) who all work together to provide you with the care you need, when you need it.  We recommend signing up for the patient portal called "MyChart".  Sign up information is provided on this After Visit Summary.  MyChart is used to connect with patients for Virtual Visits (Telemedicine).  Patients are able to view lab/test results, encounter notes, upcoming appointments, etc.  Non-urgent messages can be sent to your provider as well.   To learn more about what you can do with MyChart, go to [email or web address].    Your next appointment:   1 year(s)  The format for your next appointment:   In  Person  Provider:   Thomasene Ripple, DO   Other Instructions    Adopting a Healthy Lifestyle.  Know what a healthy weight is for you (roughly BMI <25) and aim to maintain this   Aim for 7+ servings of fruits and vegetables daily   65-80+ fluid ounces of water or unsweet tea for healthy kidneys   Limit to max 1 drink of alcohol per day; avoid smoking/tobacco   Limit animal fats in diet for cholesterol and heart health - choose grass fed whenever available   Avoid highly processed foods, and foods high in saturated/trans fats   Aim for low stress - take time to unwind and care for your mental health   Aim for 150 min of moderate intensity exercise weekly for heart health, and weights twice weekly for bone health   Aim for 7-9 hours of sleep daily   When it comes to diets, agreement about the perfect plan isnt easy to find, even among the experts. Experts at the [facility] [employer] developed an idea known as the Healthy Eating Plate. Just imagine a plate divided into logical, healthy portions.   The emphasis is on diet quality:   Load up on vegetables and fruits - one-half of your plate: Aim for color and variety, and remember that potatoes dont count.   Go for whole grains - one-quarter of your plate: Whole wheat, barley, wheat berries, quinoa, oats, brown rice, and foods made with them. If you want pasta, go with whole wheat pasta.   Protein power - one-quarter of your plate: Fish, chicken, beans, and nuts are all healthy, versatile protein sources. Limit red meat.   The diet, however, does go beyond the plate, offering a few other suggestions.   Use healthy plant oils, such as olive, canola, soy, corn, sunflower and peanut. Check the labels, and avoid partially hydrogenated oil, which have unhealthy trans fats.   If youre thirsty, drink water. Coffee and tea are good in moderation, but skip sugary drinks and limit milk and dairy products to one or two daily  servings.   The type of carbohydrate in the diet is more important than the amount. Some sources of carbohydrates, such as vegetables, fruits, whole grains, and beans-are healthier than others.   Finally,  stay active  SignedThomasene Ripple, DO  08/29/2021 4:22 PM    [facility]

## 2021-08-29 NOTE — Patient Instructions (Signed)

## 2021-09-06 ENCOUNTER — Ambulatory Visit: Payer: PRIVATE HEALTH INSURANCE | Admitting: Cardiology

## 2021-09-07 ENCOUNTER — Ambulatory Visit: Payer: PRIVATE HEALTH INSURANCE | Admitting: Cardiology

## 2022-05-24 ENCOUNTER — Other Ambulatory Visit: Payer: Self-pay | Admitting: Cardiology

## 2023-05-07 ENCOUNTER — Other Ambulatory Visit: Payer: Self-pay | Admitting: *Deleted

## 2023-05-07 MED ORDER — ROSUVASTATIN CALCIUM 20 MG PO TABS: 20.0000 mg | ORAL_TABLET | Freq: Every day | ORAL | 0 refills | Status: DC

## 2023-06-19 ENCOUNTER — Other Ambulatory Visit: Payer: Self-pay

## 2023-06-19 MED ORDER — ROSUVASTATIN CALCIUM 20 MG PO TABS: 20.0000 mg | ORAL_TABLET | Freq: Every day | ORAL | 0 refills | Status: AC
